# Patient Record
Sex: Male | Born: 1960 | Race: White | Hispanic: No | Marital: Married | Smoking: Never smoker
Health system: Southern US, Community
[De-identification: ages and names within clinical notes are randomized; demographics above are authoritative.]

## PROBLEM LIST (undated history)

## (undated) DIAGNOSIS — K219 Gastro-esophageal reflux disease without esophagitis: Secondary | ICD-10-CM

## (undated) DIAGNOSIS — G249 Dystonia, unspecified: Secondary | ICD-10-CM

## (undated) DIAGNOSIS — K8020 Calculus of gallbladder without cholecystitis without obstruction: Secondary | ICD-10-CM

## (undated) HISTORY — PX: OTHER SURGICAL HISTORY: SHX169

## (undated) HISTORY — DX: Calculus of gallbladder without cholecystitis without obstruction: K80.20

## (undated) HISTORY — DX: Gastro-esophageal reflux disease without esophagitis: K21.9

---

## 2016-09-11 ENCOUNTER — Encounter (HOSPITAL_COMMUNITY): Payer: Self-pay | Admitting: Emergency Medicine

## 2016-09-11 ENCOUNTER — Emergency Department (HOSPITAL_COMMUNITY)
Admission: EM | Admit: 2016-09-11 | Discharge: 2016-09-11 | Disposition: A | Discharge status: Home / Self Care | Payer: 59 | Attending: Emergency Medicine | Admitting: Emergency Medicine

## 2016-09-11 ENCOUNTER — Emergency Department (HOSPITAL_COMMUNITY): Payer: 59

## 2016-09-11 DIAGNOSIS — Y9361 Activity, american tackle football: Secondary | ICD-10-CM | POA: Insufficient documentation

## 2016-09-11 DIAGNOSIS — Y999 Unspecified external cause status: Secondary | ICD-10-CM | POA: Insufficient documentation

## 2016-09-11 DIAGNOSIS — S99912A Unspecified injury of left ankle, initial encounter: Secondary | ICD-10-CM | POA: Diagnosis present

## 2016-09-11 DIAGNOSIS — Y929 Unspecified place or not applicable: Secondary | ICD-10-CM | POA: Diagnosis not present

## 2016-09-11 DIAGNOSIS — S82892A Other fracture of left lower leg, initial encounter for closed fracture: Secondary | ICD-10-CM

## 2016-09-11 DIAGNOSIS — S82432A Displaced oblique fracture of shaft of left fibula, initial encounter for closed fracture: Secondary | ICD-10-CM | POA: Diagnosis not present

## 2016-09-11 DIAGNOSIS — W1849XA Other slipping, tripping and stumbling without falling, initial encounter: Secondary | ICD-10-CM | POA: Insufficient documentation

## 2016-09-11 HISTORY — DX: Dystonia, unspecified: G24.9

## 2016-09-11 MED ORDER — HYDROCODONE-ACETAMINOPHEN 5-325 MG PO TABS
1.0000 | ORAL_TABLET | Freq: Once | ORAL | Status: AC
  Administered 2016-09-11: 1 via ORAL
  Filled 2016-09-11: qty 1

## 2016-09-11 MED ORDER — HYDROCODONE-ACETAMINOPHEN 5-325 MG PO TABS: 1.0000 | ORAL_TABLET | ORAL | 0 refills | Status: DC | PRN

## 2016-09-11 NOTE — ED Provider Notes (Signed)
[facility]-EMERGENCY DEPT Provider Note   CSN: [id number] Arrival date & time: 09/11/16  1540  By signing my name below, I, Sonum Patel, attest that this documentation has been prepared under the direction and in the presence of Slayde Brault, PA-C. Electronically Signed: Sonum Patel, Neurosurgeoncribe. 09/11/16. 6:15 PM.  History   Chief Complaint Chief Complaint  Patient presents with  . Ankle Pain    The history is provided by the patient. No language interpreter was used.     HPI Comments: Bryce Bates is a 55 y.o. male who presents to the Emergency Department complaining of left ankle injury that occurred earlier today. He was playing football when he slipped on wet grass and inverted his ankle. He reports hearing 3 pop/crack noises when the injury occurred. He reports moderate left ankle pain which is worse with bearing weight. He has taken Advil 600 mg earlier; states the pain was initially 8/10 and now is 5/10. He does not take anti-coagulants. Denies any numbness or weakness. Denies other injury or complaint.  Past Medical History:  Diagnosis Date  . Dystonia     There are no active problems to display for this patient.   History reviewed. No pertinent surgical history.   Home Medications    Prior to Admission medications   Not on File    Family History No family history on file.  Social History Social History  Substance Use Topics  . Smoking status: Never Smoker  . Smokeless tobacco: Not on file  . Alcohol use No     Allergies   Patient has no allergy information on record.   Review of Systems Review of Systems  Musculoskeletal: Positive for arthralgias and joint swelling.  Neurological: Negative for weakness and numbness.  All other systems reviewed and are negative.    Physical Exam Updated Vital Signs BP 133/87 (BP Location: Right Arm)   Pulse 68   Temp 98.3 F (36.8 C) (Oral)   Resp 16   SpO2 100%   Physical Exam  Constitutional: He is oriented to  person, place, and time. He appears well-developed and well-nourished.  HENT:  Head: Normocephalic and atraumatic.  Cardiovascular: Normal rate.   Pulmonary/Chest: Effort normal.  Musculoskeletal:  Left lateral ankle edematous with tenderness over lateral malleolus. Limited ROM of ankle. 2+ DP and PT. Brisk cap refill.  Neurological: He is alert and oriented to person, place, and time.  Skin: Skin is warm and dry.  Psychiatric: He has a normal mood and affect.  Nursing note and vitals reviewed.    ED Treatments / Results  DIAGNOSTIC STUDIES: Oxygen Saturation is 100% on RA, normal by my interpretation.    COORDINATION OF CARE: 6:16 PM Discussed treatment plan with pt at bedside and pt agreed to plan.   Labs (all labs ordered are listed, but only abnormal results are displayed) Labs Reviewed - No data to display  EKG  EKG Interpretation None       Radiology Dg Ankle Complete Left  Result Date: 09/11/2016 CLINICAL DATA:  Left ankle pain laterally. EXAM: LEFT ANKLE COMPLETE - 3+ VIEW COMPARISON:  None. FINDINGS: Oblique minimally displaced fracture of the distal fibular metaphysis without significant callus formation. Soft tissue swelling overlying the lateral malleolus. No other fracture or dislocation.  Intact ankle mortise. IMPRESSION: Oblique minimally displaced fracture of the distal fibular metaphysis without significant callus formation. Soft tissue swelling overlying the lateral malleolus. Electronically Signed   By: Elige KoHetal  Patel   On: 09/11/2016 16:46  Procedures Procedures (including critical care time)  Medications Ordered in ED Medications - No data to display   Initial Impression / Assessment and Plan / ED Course  I have reviewed the triage vital signs and the nursing notes.  Pertinent labs & imaging results that were available during my care of the patient were reviewed by me and considered in my medical decision making (see chart for details).  Clinical  Course     Patient X-Ray positive for fracture otherwise neurovascularly intact.  Pt advised to follow up with orthopedics. Patient given posterior short leg with sugar tong and crutches while in ED, conservative therapy recommended and discussed. Patient will be discharged home & is agreeable with above plan. Returns precautions discussed. Pt appears safe for discharge.   Final Clinical Impressions(s) / ED Diagnoses   Final diagnoses:  Closed fracture of left ankle, initial encounter    New Prescriptions New Prescriptions   HYDROCODONE-ACETAMINOPHEN (NORCO/VICODIN) 5-325 MG TABLET    Take 1 tablet by mouth every 4 (four) hours as needed.   I personally performed the services described in this documentation, which was scribed in my presence. The recorded information has been reviewed and is accurate.    Carlene CoriaSerena Y Marques Ericson, PA-C 09/11/16 1857    Jacalyn LefevreJulie Haviland, MD 09/11/16 2026

## 2016-09-11 NOTE — ED Triage Notes (Signed)
Per pt, states he was playing touch football with son with her twisted left ankle-pain and swelling

## 2016-09-11 NOTE — Discharge Instructions (Signed)
Follow up with Dr. Eulah PontMurphy of orthopedic surgery as soon as possible. Take Norco as prescribed as needed for pain. If your pain is not that severe you may take extra strength tylenol. Do not take tylenol with norco together. Return to the ER for new or worsening symptoms.

## 2017-02-03 ENCOUNTER — Ambulatory Visit (INDEPENDENT_AMBULATORY_CARE_PROVIDER_SITE_OTHER): Payer: 59 | Admitting: Family Medicine

## 2017-02-03 ENCOUNTER — Encounter: Payer: Self-pay | Admitting: Family Medicine

## 2017-02-03 VITALS — BP 109/73 | HR 67 | Temp 98.8°F | Resp 17 | Ht 72.0 in | Wt 181.0 lb

## 2017-02-03 DIAGNOSIS — R05 Cough: Secondary | ICD-10-CM | POA: Diagnosis not present

## 2017-02-03 DIAGNOSIS — R059 Cough, unspecified: Secondary | ICD-10-CM

## 2017-02-03 NOTE — Patient Instructions (Addendum)
Thank you for coming in,   You can try over the counter medications for your cough.   If you develop more production or a fever then please return.   If your cough gets worse then please return as well.    Please feel free to call with any questions or concerns at any time, at [phone]. --Dr. Jordan Likes    IF you received an x-ray today, you will receive an invoice from [facility]. Please contact [facility] at [phone] with questions or concerns regarding your invoice.   IF you received labwork today, you will receive an invoice from [place]. Please contact [facility] at [phone] with questions or concerns regarding your invoice.   Our billing staff will not be able to assist you with questions regarding bills from these companies.  You will be contacted with the lab results as soon as they are available. The fastest way to get your results is to activate your My Chart account. Instructions are located on the last page of this paperwork. If you have not heard from [place] regarding the results in 2 weeks, please contact this office.

## 2017-02-03 NOTE — Progress Notes (Signed)
  Zacharias Ridling - 56 y.o. male MRN [id number]  Date of birth: 1961/05/15  SUBJECTIVE:  Including CC & ROS.  Chief Complaint  Patient presents with  . Cough    onseet 8 days  . Generalized Body Aches    last week, has subsided  . Sinusitis    onset 4 days  . Fatigue   Mr. Troop is a 56 yo M that is presenting with cough after having flu like symptoms. Last Thursday when his last fever was. His cough is occurring during the day and night. There is no production. Has tried robitussin DM. Has a hacking cough. No exposure to TB. No travel. No pet exposure. No edema. No history of heart problems. No new medications.   ROS: No unexpected weight loss, fever, chills, swelling, instability, muscle pain, numbness/tingling, redness, otherwise see HPI    HISTORY: Past Medical, Surgical, Social, and Family History Reviewed & Updated per EMR.   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  Psych: alert and oriented   ASSESSMENT & PLAN:   Cough Most likely post viral in nature. Exam is reassuring.  - supportive care - given indications to return.

## 2017-02-03 NOTE — Assessment & Plan Note (Signed)
Most likely post viral in nature. Exam is reassuring.  - supportive care - given indications to return.

## 2017-11-21 IMAGING — CR DG ANKLE COMPLETE 3+V*L*
3 series · 3 of 3 positions shown · non-contrast
Comparison: None.

CLINICAL DATA: Left ankle pain laterally.

EXAM:
LEFT ANKLE COMPLETE - 3+ VIEW

[x ankle ap left]
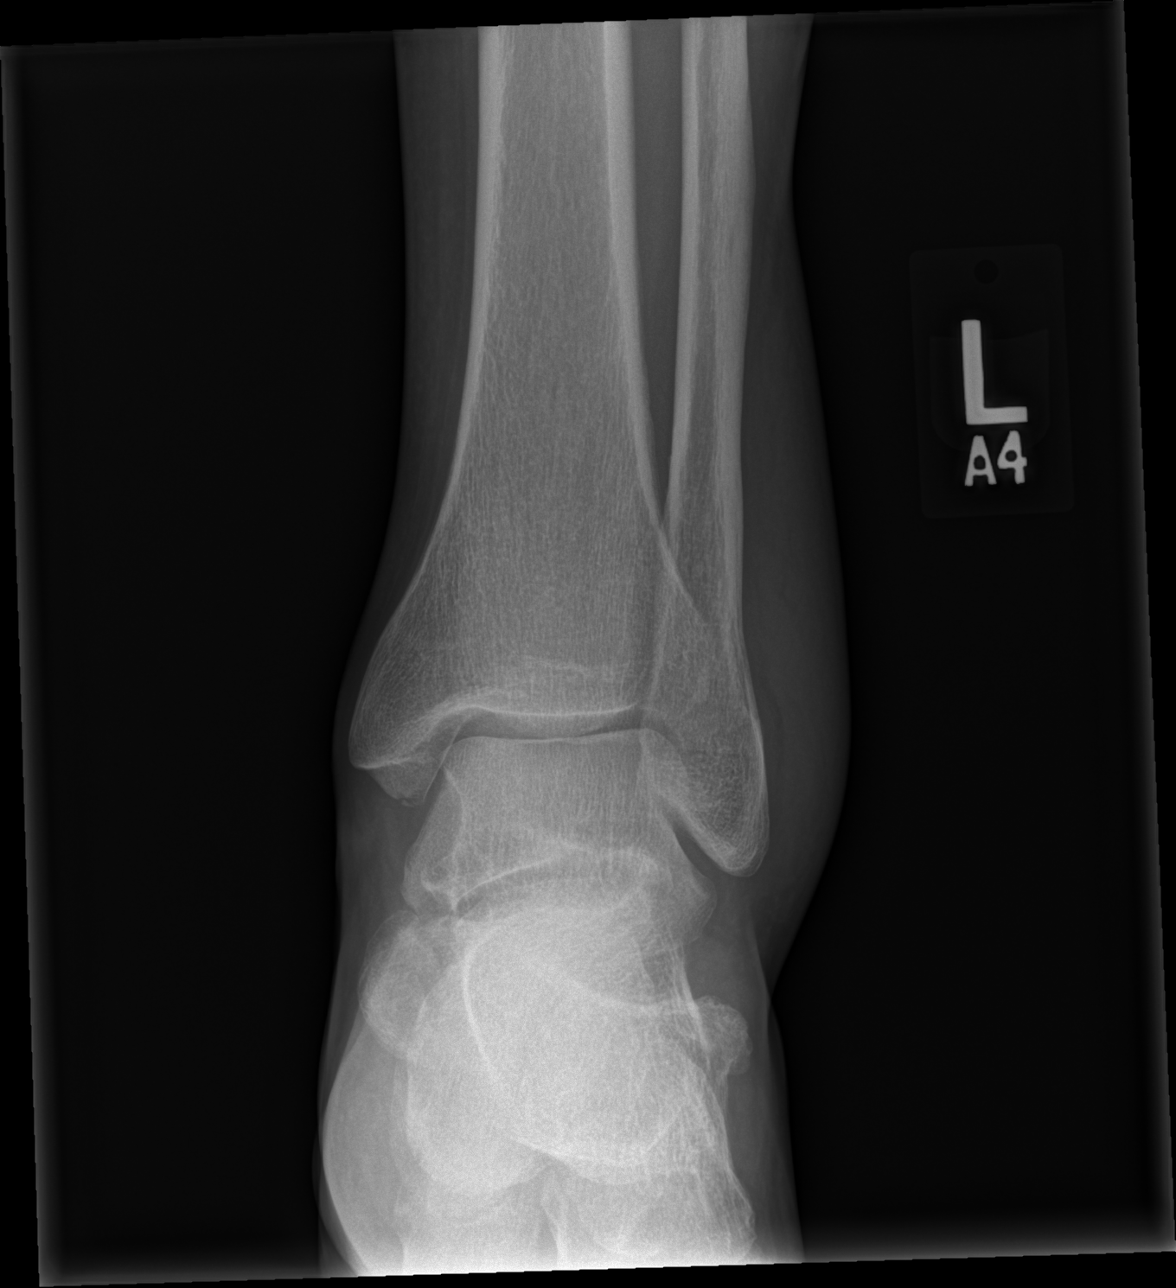

[x ankle obl left]
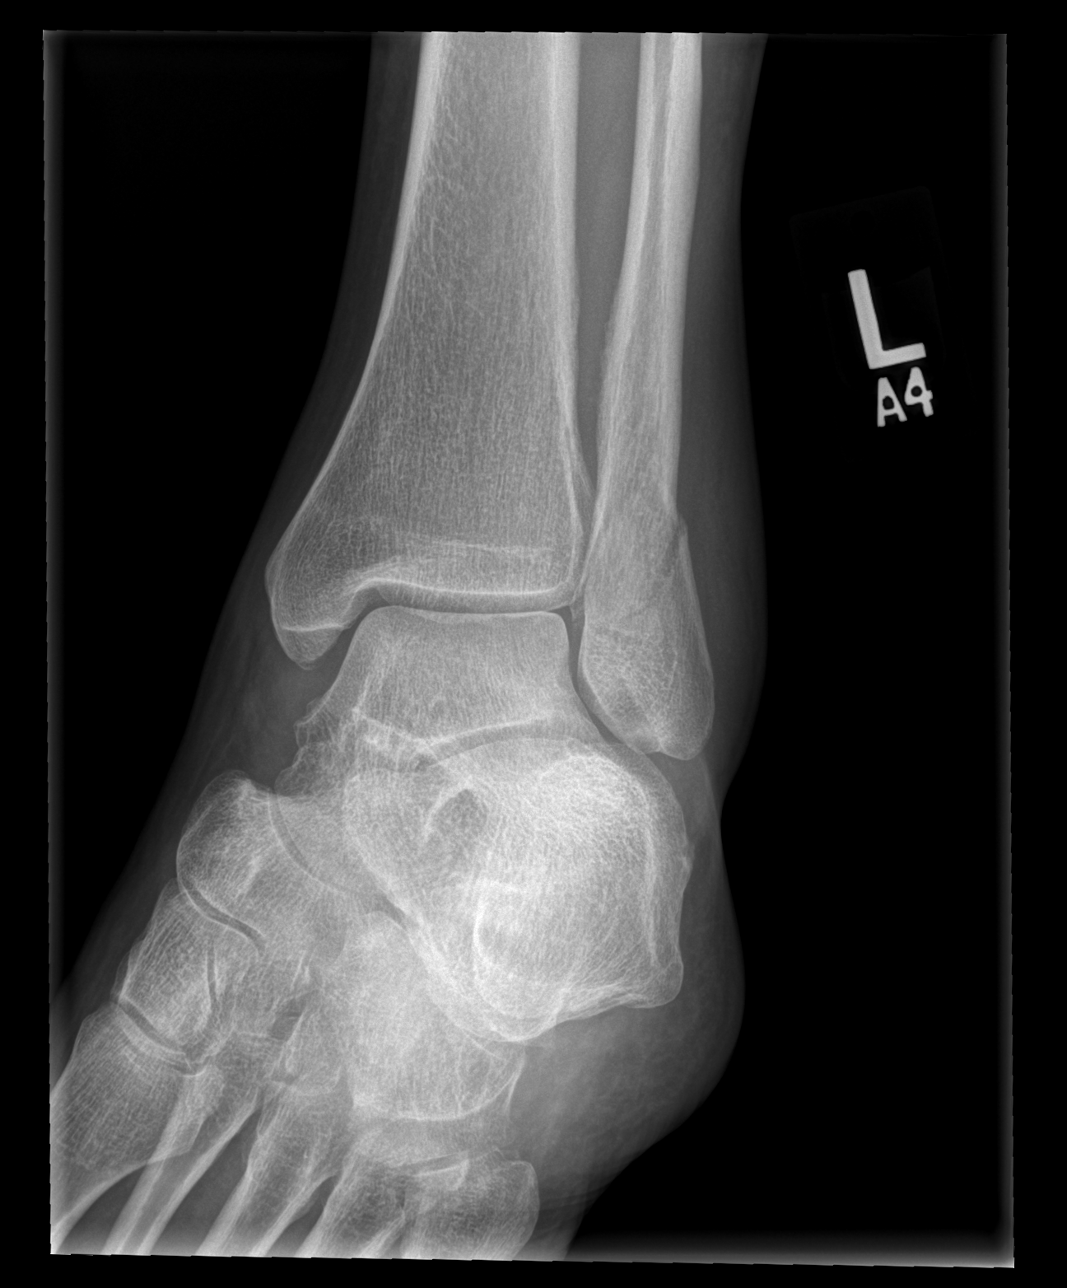

[x ankle lat left]
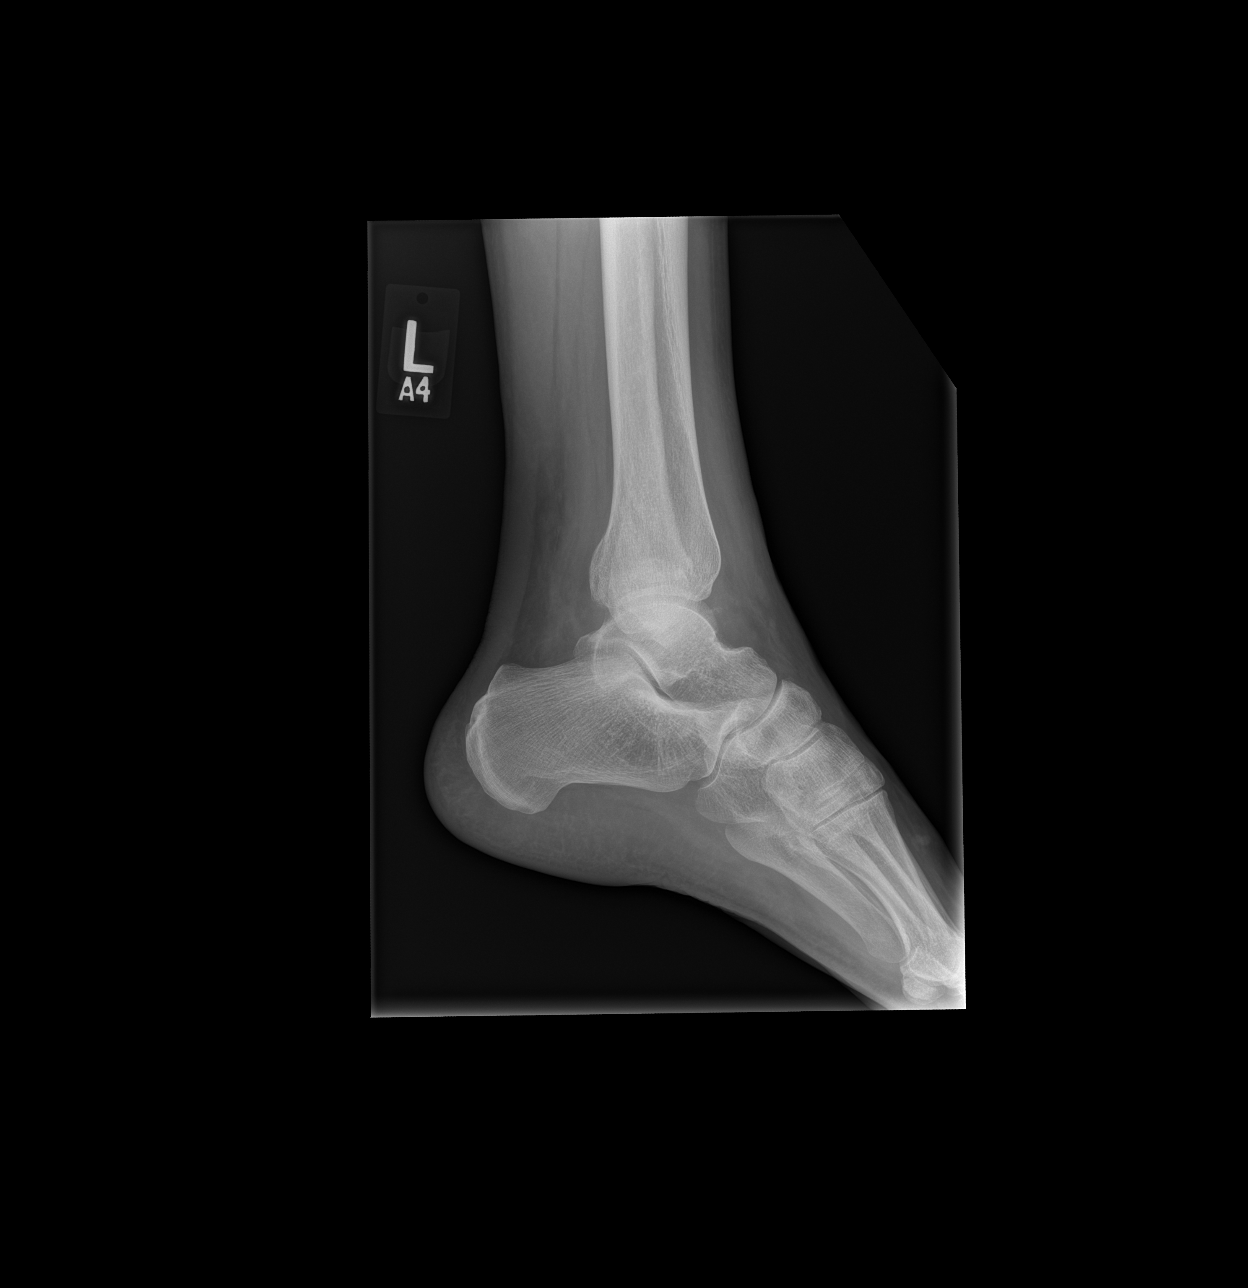

[3 of 3 positions shown; findings below may reference images not displayed]

FINDINGS: Oblique minimally displaced fracture of the distal fibular
metaphysis without significant callus formation. Soft tissue
swelling overlying the lateral malleolus.

No other fracture or dislocation.  Intact ankle mortise.
IMPRESSION: Oblique minimally displaced fracture of the distal fibular
metaphysis without significant callus formation. Soft tissue
swelling overlying the lateral malleolus.
